# Patient Record
Sex: Male | Born: 1990 | Race: Black or African American | Hispanic: No | Marital: Single | Smoking: Current some day smoker
Health system: Southern US, Community
[De-identification: ages and names within clinical notes are randomized; demographics above are authoritative.]

## PROBLEM LIST (undated history)

## (undated) NOTE — ED Notes (Signed)
 Formatting of this note is different from the original. Emergency Services RN Report  S (Situation):  Chief Complaint(s):  Hand Injury  Associated Symptoms: None  B (Background):  Events leading to and Treatment PTA: Pt arrives with pain and swelling to right hand from reportedly slamming it in a vehicle door. Pt able to move fingers, nothing taken for pain.   Past Medical History:  No past medical history on file.  A (Assessment):  See documentation for Focused Assessment.  Initial Vital Signs:  ED Triage Vitals [03/18/22 1543]  Enc Vitals Group     BP 126/74     Heart Rate 56     Respiratory Rate 16     Temp 36.8 C (98.2 F)     Temp src Oral     SpO2 98 %     Weight 63.5 kg (140 lb)     Height 1.702 m (5' 7)     Head Circumference      Peak Flow      Pain Score      Pain Loc      Pain Edu?      Excl. in GC?    Pain on Presentation:  Pain Location: Hand   Safety:  Patient is oriented to person, place, time/date, and situation. The patient is oriented and cooperative.  Patient is able to make needs known by using call light appropriately.  Does the patient require enhanced safety measures? No  Mobility:  The patient?s mobility is ambulates independently   Isolation Needs: None  Learning Needs Assessment:   Primary Learner Name: Amiel Pepper  Primary Learner Role: Patient  Barriers to Learning: No Barriers  Primary Language for the Primary Learner: English  Is an interpreter Required: no  How does the primary learner prefer to learn new concepts: Listening   Patient?s Desired Outcome: ?What are you hoping to get out of today?s visit??: Help with hand pain  R (Recommendation):   Plan of Care; developed with patient: Yes Comfort and Safety: see Intentional Rounding, MAR Activations:  None Tests/Procedures:   ED Medications and Procedures  XR HAND MIN 3 VIEWS    Electronically signed by Stanek, [place], RN at 03/18/2022  3:52 PM CDT

## (undated) NOTE — ED Provider Notes (Signed)
 Formatting of this note is different from the original. Images from the original note were not included. Chief complaint: Allergic reaction (Pt states that 30 min ago, he ate some cucumbers. Per pt. He has a known pickle allergy. Pt is breaking out in hives on his face and arms. Pts lips and face are swelling. 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 Approximately 30 minutes prior to arrival the patient ate cucumbers seasoned with vinegar. Notes an allergy to pickles. Shortly after he noticed swelling to his eyes and tongue. Has also noted hives to his arms and around his eyes. Locates a tightness to his throat, notices his breathing is becoming more labored. No medication allergies. No treatments prior to arrival.     Data Review: I reviewed nursing notes and vital signs from current ED visit. I performed a limited review of the electronic medical record, ED nursing notes, past medical history and allergies.   Past Medical History: No past medical history on file. No past surgical history on file.   Medications: Medication list was reviewed by me. Current Outpatient Medications  Medication Sig   EPINEPHrine  (Epipen ) 0.3 MG/0.3ML auto-injector pen Inject 0.3 mL into muscle once as needed for Anaphylaxis   predniSONE  (Deltasone ) 50 MG tablet Take 1 (one) tablet by mouth once daily for 3 days    Physical Exam:  Patient Vitals for the past 24 hrs:  Temp Pulse Resp BP SpO2  08/21/23 2100 -- 64 -- 131/81 98 %  08/21/23 2030 -- 69 -- 135/87 100 %  08/21/23 2029 -- 72 -- -- 100 %  08/21/23 2005 -- 66 -- (!) 151/106 100 %  08/21/23 1937 -- 55 -- (!) 131/113 --  08/21/23 1905 97.9 F (36.6 C) 97 18 (!) 139/108 98 %   Nursing note and vitals reviewed.  General: Alert & Oriented to person, place, time & reason for visit.  No acute distress. Head: Normocephalic. Atraumatic. Eyes: Pupils equal and round.  EOM intact. Clear conjunctivae. No discharge.  There is periorbital edema with urticaria over the upper lids. Ears: External ears normal. Nose: External nose normal. Nares patent bilaterally. Mouth: Moist mucous membranes. Upper and lower labial edema. Neck: Trachea midline. Pulmonary: Non-labored breathing. No adverse respiratory sounds on approach. No stridor. Lungs clear to auscultation bilaterally.  No wheezes, rhonchi or rales.  Cardiovascular: Regular rate. Regular rhythm.  Well perfused.  Radial pulses intact bilaterally.  Skin: Warm, dry. Non-diaphoretic. Urticaria to the volar bilateral forearms.  Neuro: GCS 15. Moving all extremities with grossly intact strength. Ambulates without difficulty. Psych: Behavior and affect normal. Pleasant mood.    Orders placed this encounter: ED Medication Administration from 08/21/2023 1856 to 08/21/2023 2144     Date/Time Order Dose Route Action Action by   08/21/2023 1931 CDT EPINEPHrine  1 MG/ML injection 0.3 mg 0.3 mg Intramuscular $ Given Erich Domino, RN   08/21/2023 1937 CDT 0.9% NaCl IV bolus 1,000 mL Intravenous $ New Bag/Syringe Erich Domino, RN   08/21/2023 1938 CDT diphenhydrAMINE  (Benadryl ) injection 50 mg 50 mg Intravenous $ Given Erich Domino, RN   08/21/2023 1949 CDT famotidine  (Pepcid ) injection 40 mg 40 mg Intravenous $ Given Erich Domino, RN   08/21/2023 1944 CDT methylPREDNISolone  sod succ (SOLU-Medrol ) injection 125 mg 125 mg Intravenous $ Given Erich Domino, RN  Cardiopulmonary: none Labs: none No results found for any visits on 08/21/23. Diagnostic imaging: none No results found.    PROCEDURE DOCUMENTATION: Observation Care: Emergency Department Observation Documentation:  ED Observation: The patient was placed under ED observation status to determine the need for inpatient admission vs discharge. . Time: 7:14PM. Date: 08/21/2023.   Reason(s) for observation: Allergic reaction Evaluation/treatment plan: telemetry monitoring, IV fluids, and IV steroids Reassessment and update(s): reassessed patient at 9:00PM at which time urticaria has significantly improved, patient feeling better. Will hold until 2 hours post epi  Discharge from Observation: The patient was discharged from ED observation status. . Time: 9:35PM. Date: 08/21/2023.  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 Patient arrives nontoxic appearing, in no obvious respiratory distress. Initial vital signs upon arrival show hypertension. Here after presumed allergic reaction to cucumber. Primary survey demonstrates no sign of airway obstruction, no stridor, patient sitting upright in no obvious respiratory distress, Sp02 98% on room air. Labial and periorbital edema with urticarial rash around eyes and forearms with complaints of throat tightness. Given multiple symptoms involved, did feel epi was indicated for anaphylaxis.  The patient was treated symptomatically with IM epi, IV solumedrol and antihistamines (H1 and H2) and observed in the ED without deterioration of condition. Strongly advised continued antihistamine use specifically cetirizine. I did place an order for epi pen to have at home. Three days of prednisone  sent to the pharmacy for him to take over the next several days. Vital signs obtained prior to final disposition, patient hemodynamically stable, hypertension reduced to an acceptable range.    Counseling: Visualized patient ambulate in the ED without difficulty.  Stable for discharge home.  Specific recommendations and return precautions discussed as well as outlined in discharge instructions provided. Encouraged to follow up with primary care. Workup and plan of care discussed in detail. Questions answered and patient/family agreeable.  Medications Prescribed this Visit       Sig   EPINEPHrine  (Epipen ) 0.3 MG/0.3ML auto-injector pen Inject 0.3 mL into muscle once as needed for Anaphylaxis   predniSONE  (Deltasone ) 50 MG tablet Take 1 (one) tablet by mouth once daily for 3 days     Medical Complexity: Critical Care Documentation:  The patient has a critical illness or injury that acutely impairs one or more vital organs systems such that there is a high probability of imminent or life-threatening deterioration in the patient's condition. The patient's care requires complex decision making, monitoring and interventions to prevent clinical deterioration (see MDM for further details). I was immediately available for care of this patient.   Critical Care Condition(s):  AIRWAY: Allergic reaction requiring IM or IV epinephrine   Critical Care Time: < 30 min: 20  minutes was provided by Laddie JONELLE Sayre, PA-C. Critical care time was exclusive of separately billable procedures.    Diagnosis: 1. Anaphylaxis, initial encounter    Disposition:  Discharge   My supervising physician for this case was  Medford Ned, DO, who did evaluate this patient personally. Supervising physician was available for questions as well as guidance on assessment and plan.   Laddie Sayre, MPAS, PA-C 08/21/2023    Cosigned by Ned Lonni HERO, DO at 08/22/2023 12:20 AM CDT Electronically signed  by Vicky Laddie SAUNDERS, PA-C at 08/21/2023  9:45 PM CDT Electronically signed by Debby Lonni HERO, DO at 08/22/2023 12:20 AM CDT  Associated attestation - Debby Lonni HERO, DO - 08/22/2023 12:20 AM CDT Formatting of this note might be different from the original. 08/22/2023 00:19  For this patient  encounter, I reviewed the NP or PA documentation, procedures (if done), treatment plan, and medical decision making; and I had face-to-face time with this patient.   I have conducted an independent evaluation of this patient including a focused history and a physical exam - which are in concordance with the NP/PA documentation except as otherwise noted.  I provided a substantive portion of the care of this patient. I personally performed a majority of the medical decision making for this patient encounter.   Differential diagnosis includes allergic reaction, anaphylaxis On my exam patient did have some perioral and periorbital edema the oropharynx was clear his voice was normal there was no stridor he did have hives to his upper extremities and his upper back Patient was given IM epinephrine  in addition to antihistamine adjuncts Patient was doing well did not need any repeat dosing of epinephrine  was observed for 2 hours and was feeling comfortable at discharge

## (undated) NOTE — ED Notes (Signed)
 Formatting of this note might be different from the original. Pt is awake and alert GCS 15. Breathing is regular and nonlabored. Skin is warm and dry. Gait is steady with no assistance. Proper discharge clothing. Pt and partner provided discharge instructions. Medication education reviewed. Signs and symptoms warranting return discussed. Discharge teaching successful as evidence by no further questions/concerns/needs. Pt ready for discharge.   Electronically signed by Erich Domino, RN at 08/21/2023 10:50 PM CDT

## (undated) NOTE — ED Provider Notes (Signed)
 Formatting of this note is different from the original. Chief Complaint from Triage:  Chief Complaint  Patient presents with  ? Hand Injury    History of Present Illness: This 89 y.o. male patient presents to the ER for right hand injury.  Patient states he excellently slammed in a car door.  Pain yesterday and is worsening swelling.  Pain in his wrist.  No pain medication taken prior to arrival.  Medications & Allergies: Medication List  Reviewed  Past Medical History: No past medical history on file.  Past Surgical History: No past surgical history on file.  Family History: No family history on file.  Social History: Social History   Socioeconomic History  ? Marital status: Single   Review of Systems:  Physical Examination: BP 126/74   Pulse 56   Temp 36.8 C (98.2 F) (Oral)   Resp 16   Ht 170.2 cm (5' 7)   Wt 63.5 kg (140 lb)   SpO2 98%   BMI 21.93 kg/m   Constitutional: Well appearing.  No acute distress  Musculoskeletal: Edema dorsally and tenderness.  Able to range wrist freely.  Neurovascular intact.  ED Course:  6 year old male with right hand injury.  X-rays show proximal fourth metacarpal fracture.  See report below.  Patient will be splinted and recommend follow-up in hand/orthopedics clinic.  Tylenol  and ibuprofen  for pain.  Imaging Results     XR Hand Min 3 Views R (Final result)  Result time 03/18/22 16:35:37   Final result by Arthea GORMAN Ferrara, MD (03/18/22 16:35:37)       Impression:   Impression: Moderately displaced and mildly angulated fourth metacarpal proximal metaphyseal fracture with probable minimally displaced proximal fifth metacarpal metaphyseal fracture.  Arthea Ferrara - Electronically signed on 03/18/2022 1635      Narrative:   **THIS IS A SIGNED REPORT**  XR HAND MIN 3 VIEWS 03/18/2022   PROVIDED INDICATION: pain and swelling  COMPARISON: 07/19/2018  TECHNIQUE: Frontal, lateral and oblique views of the right  hand.  FINDINGS: Fractures seen involving the fourth metacarpal base with moderate displacement. An additional mildly displaced fractures seen of the fifth metacarpal base.          Nurses notes reviewed. Results: No results found for this or any previous visit (from the past 12 hour(s)). Medications  ibuprofen  tablet (600 mg Oral Given 03/18/22 1554)   Diagnosis:   ICD-10-CM   1. Closed fracture of right hand, initial encounter  S62.91XA Amb Ref to Ortho - Trauma [facility]- Urgent    Disposition: Discharge   Prescribed Medications: Discharge Medication List as of 03/18/2022  4:33 PM     Deward CHRISTELLA Neri, MD 03/18/22 1712  Electronically signed by Neri Deward CHRISTELLA, MD at 03/18/2022  5:12 PM CDT

## (undated) NOTE — ED Triage Notes (Signed)
 Formatting of this note might be different from the original. Patient arriving at Emergency Department (ED) with CC: right hand injury. Occurred roughly 1530 yesterday. Patient got hand slammed in car door. No medications PTA. No broken skin. +swelling noted. Patient is able to wiggle fingers. +radial pulse felt. Electronically signed by Zimmermann, [place] R, RN at 03/18/2022  3:42 PM CDT

## (undated) NOTE — ED Provider Notes (Signed)
 Formatting of this note might be different from the original. Splint Location: right hand Type of splint applied: volar short arm After application of stockinet the orthoglass material was moistened with cool water and wrung out.  It was applied to the affected extremity and affixed with an ACE wrap.  The extremity was held in a position of function and CMS was tested before and after and intact. Patient reported no complaints during the procedure. Verbal consent was obtained prior to application.  Oneil Coe, [place] 03/18/22 1632  Cosigned by Stacy Deward HERO, MD at 03/18/2022  9:17 PM CDT Electronically signed by Coe Oneil, PA at 03/18/2022  4:32 PM CDT Electronically signed by Stacy Deward HERO, MD at 03/18/2022  9:17 PM CDT

---

## 2017-04-04 ENCOUNTER — Ambulatory Visit (HOSPITAL_COMMUNITY)
Admission: EM | Admit: 2017-04-04 | Discharge: 2017-04-04 | Disposition: A | Discharge status: Home / Self Care | Payer: Self-pay | Attending: Internal Medicine | Admitting: Internal Medicine

## 2017-04-04 ENCOUNTER — Encounter (HOSPITAL_COMMUNITY): Payer: Self-pay

## 2017-04-04 ENCOUNTER — Ambulatory Visit (INDEPENDENT_AMBULATORY_CARE_PROVIDER_SITE_OTHER): Payer: Self-pay

## 2017-04-04 DIAGNOSIS — F17210 Nicotine dependence, cigarettes, uncomplicated: Secondary | ICD-10-CM | POA: Insufficient documentation

## 2017-04-04 DIAGNOSIS — M795 Residual foreign body in soft tissue: Secondary | ICD-10-CM

## 2017-04-04 DIAGNOSIS — S60221A Contusion of right hand, initial encounter: Secondary | ICD-10-CM | POA: Insufficient documentation

## 2017-04-04 DIAGNOSIS — Z202 Contact with and (suspected) exposure to infections with a predominantly sexual mode of transmission: Secondary | ICD-10-CM | POA: Insufficient documentation

## 2017-04-04 DIAGNOSIS — Z189 Retained foreign body fragments, unspecified material: Secondary | ICD-10-CM

## 2017-04-04 DIAGNOSIS — M79641 Pain in right hand: Secondary | ICD-10-CM

## 2017-04-04 DIAGNOSIS — T8189XA Other complications of procedures, not elsewhere classified, initial encounter: Secondary | ICD-10-CM

## 2017-04-04 DIAGNOSIS — X58XXXA Exposure to other specified factors, initial encounter: Secondary | ICD-10-CM | POA: Insufficient documentation

## 2017-04-04 DIAGNOSIS — Z113 Encounter for screening for infections with a predominantly sexual mode of transmission: Secondary | ICD-10-CM

## 2017-04-04 MED ORDER — NAPROXEN 500 MG PO TABS: 500.0000 mg | ORAL_TABLET | Freq: Two times a day (BID) | ORAL | 0 refills | Status: DC | PRN

## 2017-04-04 MED ORDER — CEFTRIAXONE SODIUM 250 MG IJ SOLR
INTRAMUSCULAR | Status: AC
  Filled 2017-04-04: qty 250

## 2017-04-04 MED ORDER — AZITHROMYCIN 250 MG PO TABS
ORAL_TABLET | ORAL | Status: AC
  Filled 2017-04-04: qty 4

## 2017-04-04 MED ORDER — CEFTRIAXONE SODIUM 250 MG IJ SOLR
250.0000 mg | Freq: Once | INTRAMUSCULAR | Status: AC
  Administered 2017-04-04: 250 mg via INTRAMUSCULAR

## 2017-04-04 MED ORDER — AZITHROMYCIN 250 MG PO TABS
1000.0000 mg | ORAL_TABLET | Freq: Once | ORAL | Status: AC
  Administered 2017-04-04: 1000 mg via ORAL

## 2017-04-04 NOTE — ED Provider Notes (Signed)
CSN: [id number]     Arrival date & time 04/04/17  1007 History   First MD Initiated Contact with Patient 04/04/17 1153     Chief Complaint  Patient presents with  . Hand Pain   (Consider location/radiation/quality/duration/timing/severity/associated sxs/prior Treatment) 26 year old male presents with multiple concerns. 1st is injury to his right hand. He slammed his hand in the car door yesterday afternoon. He thought it would get better overnight but now he is experiencing more swelling, pain and difficulty moving his fingers. He has not taken anything for pain. 2nd concern is request for STD testing. No distinct dysuria, discharge or penile pain but has had 3 partners in the past 3 months and occasionally uses condoms. Requests treatment for STD's today. 3rd concern is stitches that were placed in the left side of his upper lip and right upper eyelid/eyebrow over 1 year ago in [place]. They were suppose to dissolve but now he sees the ends sticking out. Uncertain what to do next. No other chronic health issues. Takes no daily medication.    The history is provided by the patient.    History reviewed. No pertinent past medical history. History reviewed. No pertinent surgical history. No family history on file. Social History  Substance Use Topics  . Smoking status: Current Some Day Smoker    Types: Cigarettes  . Smokeless tobacco: Never Used  . Alcohol use 2.4 oz/week    4 Standard drinks or equivalent per week    Review of Systems  Constitutional: Negative for appetite change, chills, fatigue and fever.  HENT: Negative for sore throat.   Respiratory: Negative for cough, chest tightness, shortness of breath and wheezing.   Cardiovascular: Negative for chest pain.  Gastrointestinal: Negative for abdominal pain, diarrhea, nausea and vomiting.  Genitourinary: Negative for decreased urine volume, difficulty urinating, discharge, dysuria, flank pain, frequency, genital sores,  hematuria, penile pain, penile swelling, scrotal swelling, testicular pain and urgency.  Musculoskeletal: Positive for arthralgias and joint swelling. Negative for back pain and neck pain.  Skin: Positive for color change. Negative for rash and wound.  Neurological: Negative for dizziness, weakness, numbness and headaches.  Hematological: Negative for adenopathy.    Allergies  Patient has no known allergies.  Home Medications   Prior to Admission medications   Medication Sig Start Date End Date Taking? Authorizing Provider  naproxen (NAPROSYN) 500 MG tablet Take 1 tablet (500 mg total) by mouth 2 (two) times daily as needed for moderate pain. 04/04/17   Sudie Grumbling, NP   Meds Ordered and Administered this Visit   Medications  azithromycin [facility]) tablet 1,000 mg (1,000 mg Oral Given 04/04/17 1220)  cefTRIAXone (ROCEPHIN) injection 250 mg (250 mg Intramuscular Given 04/04/17 1219)    BP 131/80 (BP Location: Left Arm)   Pulse 83   Temp 98.3 F (36.8 C) (Oral)   Resp 20   SpO2 100%  No data found.   Physical Exam  Constitutional: He is oriented to person, place, and time. He appears well-developed and well-nourished. No distress.  HENT:  Head: Normocephalic.    Right Ear: Hearing and external ear normal.  Left Ear: Hearing and external ear normal.  Nose: Nose normal.  Mouth/Throat: Uvula is midline, oropharynx is clear and moist and mucous membranes are normal.    Ends of 2 white sutures present on left side of upper lip. Another white suture end present below right eyebrow.   Neck: Normal range of motion. Neck supple.  Cardiovascular: Normal  rate, regular rhythm and normal heart sounds.   No murmur heard. Pulmonary/Chest: Effort normal and breath sounds normal. No respiratory distress.  Abdominal: Soft. Bowel sounds are normal. There is no tenderness.  Musculoskeletal: He exhibits edema and tenderness.       Right hand: He exhibits decreased range of motion,  tenderness and swelling. He exhibits normal two-point discrimination, normal capillary refill, no deformity and no laceration. Normal sensation noted. Normal strength noted.       Hands: Swelling, tenderness along 3rd to 5th metacarpal of right hand. Slight bruising present. Unable to bend hand or fingers due to pain and swelling. Good pulses and capillary refill. No neuro deficits noted.   Neurological: He is alert and oriented to person, place, and time. He has normal strength. No sensory deficit.  Skin: Skin is warm and dry.    Urgent Care Course     Procedures (including critical care time)  Labs Review Labs Reviewed  URINE CYTOLOGY ANCILLARY ONLY    Imaging Review Dg Hand Complete Right  Result Date: 04/04/2017 CLINICAL DATA:  Right hand pain after injury in car door last night. EXAM: RIGHT HAND - COMPLETE 3+ VIEW COMPARISON:  None. FINDINGS: There is no evidence of fracture or dislocation. There is no evidence of arthropathy or other focal bone abnormality. Soft tissues are unremarkable. IMPRESSION: No acute abnormality seen in the right hand. Electronically Signed   By: Lupita RaiderJames  Green Jr, M.D.   On: 04/04/2017 12:20     Visual Acuity Review  Right Eye Distance:   Left Eye Distance:   Bilateral Distance:    Right Eye Near:   Left Eye Near:    Bilateral Near:         MDM   1. Contusion of right hand, initial encounter   2. Right hand pain   3. Potential exposure to STD   4. Retained suture, initial encounter    Reviewed x-ray results with patient. No fracture or dislocation seen. Recommend start Naproxen 500mg  twice a day as directed for pain and swelling. Keep hand in ace wrap for support and comfort. May apply ice to area for next 24 hours. Recommend follow-up here if pain and swelling do not improve within 5 days. Note written for work.  Urine obtained for STD testing. Gave Rocephin 250mg  IM and Zithromax 1g orally for treatment. No sexual intercourse for 2 weeks.  Use condoms with each and every sexual encounter. Follow-up pending lab results. Information provided regarding local Surgeon for further evaluation of retained sutures. Encouraged to call today to schedule appointment.     Sudie GrumblingAmyot, Shaunta Oncale Berry, NP 04/04/17 [phone]

## 2017-04-04 NOTE — ED Triage Notes (Signed)
Pt slammed his right hand in the car door yesterday and wanted it looked at. Can't move his hand. Didn't take anything otc. Pt also has stiches in his lip from last year and said they were suppose to be dissolvable but said some are still left in. Also has stitch in his right eyebrow that also did dissolve. And also wants a std testing, pt left a urine sample.

## 2017-04-04 NOTE — Discharge Instructions (Signed)
You were given a shot of Rocephin and oral pills (Zithromax) for potential Gonorrhea and Chlamydia. Recommend start Naproxen 500mg  twice a day as directed for pain and swelling. Keep right hand in ace wrap for support and comfort. May apply ice to area for next 24 hours. Recommend follow-up if pain and swelling do not improve within 5 days. Also follow-up pending lab results. Call the Surgeon today to schedule evaluation for suture removal.

## 2017-04-04 NOTE — ED Notes (Signed)
Urine specimen in lab 

## 2017-04-07 LAB — URINE CYTOLOGY ANCILLARY ONLY
Chlamydia: NEGATIVE
Neisseria Gonorrhea: POSITIVE — AB
Trichomonas: NEGATIVE

## 2019-05-15 ENCOUNTER — Encounter (HOSPITAL_COMMUNITY): Payer: Self-pay | Admitting: Student

## 2019-05-15 ENCOUNTER — Emergency Department (HOSPITAL_COMMUNITY)
Admission: EM | Admit: 2019-05-15 | Discharge: 2019-05-15 | Disposition: A | Discharge status: Home / Self Care | Payer: Self-pay | Attending: Emergency Medicine | Admitting: Emergency Medicine

## 2019-05-15 ENCOUNTER — Other Ambulatory Visit: Payer: Self-pay

## 2019-05-15 DIAGNOSIS — T7840XA Allergy, unspecified, initial encounter: Secondary | ICD-10-CM | POA: Insufficient documentation

## 2019-05-15 DIAGNOSIS — F17210 Nicotine dependence, cigarettes, uncomplicated: Secondary | ICD-10-CM | POA: Insufficient documentation

## 2019-05-15 MED ORDER — FAMOTIDINE IN NACL 20-0.9 MG/50ML-% IV SOLN
20.0000 mg | Freq: Once | INTRAVENOUS | Status: AC
  Administered 2019-05-15: 20 mg via INTRAVENOUS
  Filled 2019-05-15: qty 50

## 2019-05-15 MED ORDER — EPINEPHRINE 0.3 MG/0.3ML IJ SOAJ: 0.3000 mg | INTRAMUSCULAR | 0 refills | Status: AC | PRN

## 2019-05-15 MED ORDER — PREDNISONE 50 MG PO TABS: 50.0000 mg | ORAL_TABLET | Freq: Every day | ORAL | 0 refills | Status: AC

## 2019-05-15 MED ORDER — DIPHENHYDRAMINE HCL 25 MG PO TABS: 25.0000 mg | ORAL_TABLET | Freq: Four times a day (QID) | ORAL | 0 refills | Status: AC | PRN

## 2019-05-15 MED ORDER — FAMOTIDINE 20 MG PO TABS: 20.0000 mg | ORAL_TABLET | Freq: Two times a day (BID) | ORAL | 0 refills | Status: AC

## 2019-05-15 MED ORDER — METHYLPREDNISOLONE SODIUM SUCC 125 MG IJ SOLR
125.0000 mg | Freq: Once | INTRAMUSCULAR | Status: AC
  Administered 2019-05-15: 125 mg via INTRAVENOUS
  Filled 2019-05-15: qty 2

## 2019-05-15 NOTE — ED Notes (Signed)
Bed: TD17 Expected date: 05/15/19 Expected time: 11:05 AM Means of arrival: Ambulance Comments: Allergic Reaction

## 2019-05-15 NOTE — Discharge Instructions (Addendum)
You were seen in the emergency department today for an allergic reaction.  You received epinephrine as well as steroids, Benadryl, and Pepcid. We are sending you home with a course of steroids to take for the next 5 days daily.  Also send you home with Pepcid and Benadryl to take as needed for itching.   We are sending you home with an EpiPen to take as needed should you have another allergic reaction where your throat feels like it is closing or swelling.  Please use this as needed.  If you use the EpiPen be sure to call 911.  We have prescribed you new medication(s) today. Discuss the medications prescribed today with your pharmacist as they can have adverse effects and interactions with your other medicines including over the counter and prescribed medications. Seek medical evaluation if you start to experience new or abnormal symptoms after taking one of these medicines, seek care immediately if you start to experience difficulty breathing, feeling of your throat closing, facial swelling, or rash as these could be indications of a more serious allergic reaction  Do not eat anymore pickles.  Follow-up with primary care within 3 days.  Return to the ER for new or worsening symptoms or any other concerns.

## 2019-05-15 NOTE — ED Triage Notes (Signed)
Patient arrived via [facility], patient is from home and is AOx4 and ambulatory. Patient began to have trouble breathing after eating a pickle, patient consumed about 1-2 pickles. Patient called 911 due to feeling like throat was closing up. EMS have given the following in route to [facility]. Vitals stable, patient has no pain or complaints.  0.3mg  IM Epi 50mg  benadryl given IV

## 2019-05-15 NOTE — ED Notes (Signed)
ED Provider at bedside. 

## 2019-05-15 NOTE — ED Provider Notes (Addendum)
[facility]-EMERGENCY DEPT Provider Note   CSN: [id number] Arrival date & time: 05/15/19  1113     History   Chief Complaint Chief Complaint  Patient presents with  . Allergic Reaction     HPI Blake Williams is a 28 y.o. male w/ a hx of marijuana use who presents to the ED via EMS w/ complaints of allergic reaction which occurred just PTA. Patient states he woke up this AM feeling well, sometime mid morning he ate 1.5 pickles and approximately 30 minutes later developed sensation of throat swelling/closing w/ dyspnea, facial swelling & pruritus to the face & trunk. He called 911. EMS administered 0.3 mg IM Epi as 50 mg of IV benadryl. Patient states sxs are substantially improving, still feels he has a bit of facial swelling w/ itching to the chest/back. No current dyspnea. Denies nausea, vomiting, abdominal pain, chest pain, or syncope. No hx of prior. He has no known allergies prior to this. He has had pickles previously, thinks this may have been a new brand, but has never had this type of reaction. No other PO intake or new exposures that he is aware of.      HPI  History reviewed. No pertinent past medical history.  There are no active problems to display for this patient.   History reviewed. No pertinent surgical history.      Home Medications    Prior to Admission medications   Medication Sig Start Date End Date Taking? Authorizing Provider  naproxen (NAPROSYN) 500 MG tablet Take 1 tablet (500 mg total) by mouth 2 (two) times daily as needed for moderate pain. 04/04/17   Sudie GrumblingAmyot, Ann Berry, NP    Family History History reviewed. No pertinent family history.  Social History Social History   Tobacco Use  . Smoking status: Current Some Day Smoker    Types: Cigarettes  . Smokeless tobacco: Never Used  Substance Use Topics  . Alcohol use: Yes    Alcohol/week: 4.0 standard drinks    Types: 4 Standard drinks or equivalent per week    Comment:  Socially   . Drug use: Yes    Frequency: 1.0 times per week    Types: Marijuana     Allergies   Patient has no known allergies.   Review of Systems Review of Systems  Constitutional: Negative for chills and fever.  HENT: Positive for facial swelling.        + for throat closing/swelling.   Respiratory: Positive for shortness of breath.   Cardiovascular: Negative for chest pain.  Gastrointestinal: Negative for abdominal pain, nausea and vomiting.  Skin:       + for pruritus.   All other systems reviewed and are negative.    Physical Exam Updated Vital Signs BP 130/84 (BP Location: Left Arm)   Pulse 71   Temp 98.6 F (37 C) (Oral)   Resp 20   Ht 5\' 7"  (1.702 m)   Wt 59 kg   SpO2 97%   BMI 20.36 kg/m   Physical Exam Vitals signs and nursing note reviewed.  Constitutional:      General: He is not in acute distress.    Appearance: He is well-developed. He is not toxic-appearing.  HENT:     Head: Normocephalic and atraumatic.     Mouth/Throat:     Pharynx: Oropharynx is clear. Uvula midline. No pharyngeal swelling, oropharyngeal exudate or posterior oropharyngeal erythema.     Comments: Posterior oropharynx is symmetric appearing. Patient tolerating own  secretions without difficulty. No trismus. No drooling. No hot potato voice. No swelling beneath the tongue, submandibular compartment is soft. Airway is patent. No tongue/lip swelling noted.  Eyes:     General:        Right eye: No discharge.        Left eye: No discharge.     Conjunctiva/sclera: Conjunctivae normal.     Comments: Mild swelling noted to upper eyelids.   Neck:     Musculoskeletal: Neck supple.  Cardiovascular:     Rate and Rhythm: Normal rate and regular rhythm.  Pulmonary:     Effort: Pulmonary effort is normal. No respiratory distress.     Breath sounds: Normal breath sounds. No wheezing, rhonchi or rales.  Abdominal:     General: There is no distension.     Palpations: Abdomen is soft.      Tenderness: There is no abdominal tenderness.  Skin:    General: Skin is warm and dry.     Comments: There is some erythema noted to the anterior upper chest, does not seem obviously urticarial. No warmth. No fluctuance. No abscess. No vesicles. No palm/sole rashes.   Neurological:     Mental Status: He is alert.     Comments: Clear speech.   Psychiatric:        Behavior: Behavior normal.     ED Treatments / Results  Labs (all labs ordered are listed, but only abnormal results are displayed) Labs Reviewed - No data to display  EKG    Radiology No results found.  Procedures Procedures (including critical care time)  Medications Ordered in ED Medications  methylPREDNISolone sodium succinate (SOLU-MEDROL) 125 mg/2 mL injection 125 mg (125 mg Intravenous Given 05/15/19 1148)  famotidine (PEPCID) IVPB 20 mg premix (20 mg Intravenous New Bag/Given 05/15/19 1148)     Initial Impression / Assessment and Plan / ED Course  I have reviewed the triage vital signs and the nursing notes.  Pertinent labs & imaging results that were available during my care of the patient were reviewed by me and considered in my medical decision making (see chart for details).   Patient presents to the ED via EMS for allergic reaction. Has received epinephrine & benadryl en route with improvement in his sxs. On exam patient does not appear to be in respiratory distress, his airway is patent, no stridor, no lip/intra-oral edema noted. Mild swelling to upper eyelids w/ mild erythema to anterior chest otherwise benign exam. History does seem consistent w/ allergic reaction. Will administer solumedrol & pepcid w/ plan for monitoring for 3-4 hours in the ER s/p epi.   Patient had brief episode where he felt the throat closing/swelling sensation was returning however this subsided, he was evaluated @ the bedside during these sxs with a reassuring exam. He has otherwise been asymptomatic since medication  administration. Airway remains patent. He appears appropriate for discharge. Will send home with steroids, benadryl, & pepcid as well as epi pen to use as needed. Discussed not to eat anymore pickles.  I discussed treatment plan, need for follow-up, and return precautions with the patient. Provided opportunity for questions, patient confirmed understanding and is in agreement with plan.   Findings and plan of care discussed with supervising physician Dr. Juleen ChinaKohut who evaluated patient during encounter & is in agreement.   Final Clinical Impressions(s) / ED Diagnoses   Final diagnoses:  Allergic reaction, initial encounter    ED Discharge Orders  Ordered    predniSONE (DELTASONE) 50 MG tablet  Daily with breakfast     05/15/19 1530    EPINEPHrine 0.3 mg/0.3 mL IJ SOAJ injection  As needed     05/15/19 1530    diphenhydrAMINE (BENADRYL) 25 MG tablet  Every 6 hours PRN     05/15/19 1530    famotidine (PEPCID) 20 MG tablet  2 times daily     05/15/19 1530           Jahmeir Geisen, [place] R, PA-C 05/15/19 [address] [place], PA-C 05/15/19 1536    Virgel Manifold, MD 05/16/19 [phone]

## 2019-08-26 ENCOUNTER — Emergency Department (HOSPITAL_COMMUNITY)
Admission: EM | Admit: 2019-08-26 | Discharge: 2019-08-27 | Disposition: A | Discharge status: Home / Self Care | Payer: Self-pay | Attending: Emergency Medicine | Admitting: Emergency Medicine

## 2019-08-26 DIAGNOSIS — Y939 Activity, unspecified: Secondary | ICD-10-CM | POA: Insufficient documentation

## 2019-08-26 DIAGNOSIS — S50312A Abrasion of left elbow, initial encounter: Secondary | ICD-10-CM | POA: Insufficient documentation

## 2019-08-26 DIAGNOSIS — S50311A Abrasion of right elbow, initial encounter: Secondary | ICD-10-CM | POA: Insufficient documentation

## 2019-08-26 DIAGNOSIS — S0083XA Contusion of other part of head, initial encounter: Secondary | ICD-10-CM | POA: Insufficient documentation

## 2019-08-26 DIAGNOSIS — S301XXA Contusion of abdominal wall, initial encounter: Secondary | ICD-10-CM | POA: Insufficient documentation

## 2019-08-26 DIAGNOSIS — H1132 Conjunctival hemorrhage, left eye: Secondary | ICD-10-CM | POA: Insufficient documentation

## 2019-08-26 DIAGNOSIS — Y92481 Parking lot as the place of occurrence of the external cause: Secondary | ICD-10-CM | POA: Insufficient documentation

## 2019-08-26 DIAGNOSIS — S80212A Abrasion, left knee, initial encounter: Secondary | ICD-10-CM | POA: Insufficient documentation

## 2019-08-26 DIAGNOSIS — F10920 Alcohol use, unspecified with intoxication, uncomplicated: Secondary | ICD-10-CM | POA: Insufficient documentation

## 2019-08-26 DIAGNOSIS — R519 Headache, unspecified: Secondary | ICD-10-CM | POA: Insufficient documentation

## 2019-08-26 DIAGNOSIS — Y908 Blood alcohol level of 240 mg/100 ml or more: Secondary | ICD-10-CM | POA: Insufficient documentation

## 2019-08-26 DIAGNOSIS — S20212A Contusion of left front wall of thorax, initial encounter: Secondary | ICD-10-CM | POA: Insufficient documentation

## 2019-08-26 DIAGNOSIS — S80211A Abrasion, right knee, initial encounter: Secondary | ICD-10-CM | POA: Insufficient documentation

## 2019-08-26 DIAGNOSIS — Y999 Unspecified external cause status: Secondary | ICD-10-CM | POA: Insufficient documentation

## 2019-08-26 DIAGNOSIS — R4182 Altered mental status, unspecified: Secondary | ICD-10-CM | POA: Insufficient documentation

## 2019-08-27 ENCOUNTER — Encounter (HOSPITAL_COMMUNITY): Payer: Self-pay

## 2019-08-27 ENCOUNTER — Other Ambulatory Visit: Payer: Self-pay

## 2019-08-27 ENCOUNTER — Emergency Department (HOSPITAL_COMMUNITY): Payer: Self-pay

## 2019-08-27 ENCOUNTER — Encounter (HOSPITAL_COMMUNITY): Payer: Self-pay | Admitting: Student

## 2019-08-27 LAB — CBC WITH DIFFERENTIAL/PLATELET
Abs Immature Granulocytes: 0.03 10*3/uL (ref 0.00–0.07)
Basophils Absolute: 0 10*3/uL (ref 0.0–0.1)
Basophils Relative: 0 %
Eosinophils Absolute: 0 10*3/uL (ref 0.0–0.5)
Eosinophils Relative: 0 %
HCT: 48.1 % (ref 39.0–52.0)
Hemoglobin: 15.7 g/dL (ref 13.0–17.0)
Immature Granulocytes: 0 %
Lymphocytes Relative: 10 %
Lymphs Abs: 0.8 10*3/uL (ref 0.7–4.0)
MCH: 29.9 pg (ref 26.0–34.0)
MCHC: 32.6 g/dL (ref 30.0–36.0)
MCV: 91.6 fL (ref 80.0–100.0)
Monocytes Absolute: 0.3 10*3/uL (ref 0.1–1.0)
Monocytes Relative: 4 %
Neutro Abs: 6.5 10*3/uL (ref 1.7–7.7)
Neutrophils Relative %: 86 %
Platelets: 257 10*3/uL (ref 150–400)
RBC: 5.25 MIL/uL (ref 4.22–5.81)
RDW: 12 % (ref 11.5–15.5)
WBC: 7.6 10*3/uL (ref 4.0–10.5)
nRBC: 0 % (ref 0.0–0.2)

## 2019-08-27 LAB — COMPREHENSIVE METABOLIC PANEL
ALT: 22 U/L (ref 0–44)
AST: 38 U/L (ref 15–41)
Albumin: 4.9 g/dL (ref 3.5–5.0)
Alkaline Phosphatase: 36 U/L — ABNORMAL LOW (ref 38–126)
Anion gap: 15 (ref 5–15)
BUN: 10 mg/dL (ref 6–20)
CO2: 19 mmol/L — ABNORMAL LOW (ref 22–32)
Calcium: 9.4 mg/dL (ref 8.9–10.3)
Chloride: 108 mmol/L (ref 98–111)
Creatinine, Ser: 1.07 mg/dL (ref 0.61–1.24)
GFR calc Af Amer: >60 mL/min (ref >60)
GFR calc non Af Amer: >60 mL/min (ref >60)
Glucose, Bld: 107 mg/dL — ABNORMAL HIGH (ref 70–99)
Potassium: 3.2 mmol/L — ABNORMAL LOW (ref 3.5–5.1)
Sodium: 142 mmol/L (ref 135–145)
Total Bilirubin: 1.3 mg/dL — ABNORMAL HIGH (ref 0.3–1.2)
Total Protein: 7.2 g/dL (ref 6.5–8.1)

## 2019-08-27 LAB — ETHANOL: Alcohol, Ethyl (B): 307 mg/dL (ref <10)

## 2019-08-27 MED ORDER — SODIUM CHLORIDE 0.9 % IV BOLUS
1000.0000 mL | Freq: Once | INTRAVENOUS | Status: AC
  Administered 2019-08-27: 1000 mL via INTRAVENOUS

## 2019-08-27 MED ORDER — POLYMYXIN B-TRIMETHOPRIM 10000-0.1 UNIT/ML-% OP SOLN: 1.0000 [drp] | OPHTHALMIC | 0 refills | Status: AC

## 2019-08-27 MED ORDER — FLUORESCEIN SODIUM 1 MG OP STRP
1.0000 | ORAL_STRIP | Freq: Once | OPHTHALMIC | Status: DC
  Filled 2019-08-27: qty 1

## 2019-08-27 MED ORDER — TETANUS-DIPHTH-ACELL PERTUSSIS 5-2.5-18.5 LF-MCG/0.5 IM SUSP
0.5000 mL | Freq: Once | INTRAMUSCULAR | Status: DC
  Filled 2019-08-27: qty 0.5

## 2019-08-27 MED ORDER — IOHEXOL 300 MG/ML  SOLN
100.0000 mL | Freq: Once | INTRAMUSCULAR | Status: AC | PRN
  Administered 2019-08-27: 01:00:00 100 mL via INTRAVENOUS

## 2019-08-27 MED ORDER — TETRACAINE HCL 0.5 % OP SOLN
2.0000 [drp] | Freq: Once | OPHTHALMIC | Status: DC
  Filled 2019-08-27: qty 4

## 2019-08-27 NOTE — ED Notes (Signed)
Ortho tech called 
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
## 2019-08-27 NOTE — ED Notes (Signed)
Pt refusing to participate in visual acuity screening.

## 2019-08-27 NOTE — ED Notes (Signed)
Patient mostly uncooperative. Will not answer questions, unable to obtain history or complete a proper assessment.
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
## 2019-12-25 ENCOUNTER — Emergency Department (HOSPITAL_COMMUNITY)
Admission: EM | Admit: 2019-12-25 | Discharge: 2019-12-26 | Disposition: A | Discharge status: Home / Self Care | Payer: No Typology Code available for payment source | Attending: Emergency Medicine | Admitting: Emergency Medicine

## 2019-12-25 ENCOUNTER — Emergency Department (HOSPITAL_COMMUNITY): Payer: No Typology Code available for payment source

## 2019-12-25 ENCOUNTER — Encounter (HOSPITAL_COMMUNITY): Payer: Self-pay | Admitting: *Deleted

## 2019-12-25 ENCOUNTER — Other Ambulatory Visit: Payer: Self-pay

## 2019-12-25 DIAGNOSIS — K760 Fatty (change of) liver, not elsewhere classified: Secondary | ICD-10-CM | POA: Insufficient documentation

## 2019-12-25 DIAGNOSIS — Z79899 Other long term (current) drug therapy: Secondary | ICD-10-CM | POA: Diagnosis not present

## 2019-12-25 DIAGNOSIS — F10920 Alcohol use, unspecified with intoxication, uncomplicated: Secondary | ICD-10-CM | POA: Diagnosis not present

## 2019-12-25 DIAGNOSIS — Y939 Activity, unspecified: Secondary | ICD-10-CM | POA: Diagnosis not present

## 2019-12-25 DIAGNOSIS — Y906 Blood alcohol level of 120-199 mg/100 ml: Secondary | ICD-10-CM | POA: Diagnosis not present

## 2019-12-25 DIAGNOSIS — Y999 Unspecified external cause status: Secondary | ICD-10-CM | POA: Diagnosis not present

## 2019-12-25 DIAGNOSIS — M542 Cervicalgia: Secondary | ICD-10-CM | POA: Insufficient documentation

## 2019-12-25 DIAGNOSIS — S2222XA Fracture of body of sternum, initial encounter for closed fracture: Secondary | ICD-10-CM

## 2019-12-25 DIAGNOSIS — F17210 Nicotine dependence, cigarettes, uncomplicated: Secondary | ICD-10-CM | POA: Insufficient documentation

## 2019-12-25 DIAGNOSIS — F1290 Cannabis use, unspecified, uncomplicated: Secondary | ICD-10-CM | POA: Diagnosis not present

## 2019-12-25 DIAGNOSIS — R413 Other amnesia: Secondary | ICD-10-CM | POA: Insufficient documentation

## 2019-12-25 DIAGNOSIS — S22008A Other fracture of unspecified thoracic vertebra, initial encounter for closed fracture: Secondary | ICD-10-CM | POA: Diagnosis not present

## 2019-12-25 DIAGNOSIS — R0789 Other chest pain: Secondary | ICD-10-CM | POA: Insufficient documentation

## 2019-12-25 DIAGNOSIS — Y929 Unspecified place or not applicable: Secondary | ICD-10-CM | POA: Insufficient documentation

## 2019-12-25 DIAGNOSIS — T07XXXA Unspecified multiple injuries, initial encounter: Secondary | ICD-10-CM | POA: Diagnosis present

## 2019-12-25 LAB — CBC WITH DIFFERENTIAL/PLATELET
Abs Immature Granulocytes: 0.11 10*3/uL — ABNORMAL HIGH (ref 0.00–0.07)
Basophils Absolute: 0 10*3/uL (ref 0.0–0.1)
Basophils Relative: 0 %
Eosinophils Absolute: 0 10*3/uL (ref 0.0–0.5)
Eosinophils Relative: 1 %
HCT: 44.6 % (ref 39.0–52.0)
Hemoglobin: 14.5 g/dL (ref 13.0–17.0)
Immature Granulocytes: 2 %
Lymphocytes Relative: 16 %
Lymphs Abs: 0.9 10*3/uL (ref 0.7–4.0)
MCH: 29.7 pg (ref 26.0–34.0)
MCHC: 32.5 g/dL (ref 30.0–36.0)
MCV: 91.4 fL (ref 80.0–100.0)
Monocytes Absolute: 0.3 10*3/uL (ref 0.1–1.0)
Monocytes Relative: 4 %
Neutro Abs: 4.3 10*3/uL (ref 1.7–7.7)
Neutrophils Relative %: 77 %
Platelets: 103 10*3/uL — ABNORMAL LOW (ref 150–400)
RBC: 4.88 MIL/uL (ref 4.22–5.81)
RDW: 12.4 % (ref 11.5–15.5)
WBC: 5.6 10*3/uL (ref 4.0–10.5)
nRBC: 0 % (ref 0.0–0.2)

## 2019-12-25 LAB — SAMPLE TO BLOOD BANK

## 2019-12-25 LAB — I-STAT BETA HCG BLOOD, ED (MC, WL, AP ONLY): I-stat hCG, quantitative: <5 m[IU]/mL (ref <5)

## 2019-12-25 MED ORDER — MORPHINE SULFATE (PF) 4 MG/ML IV SOLN
4.0000 mg | Freq: Once | INTRAVENOUS | Status: AC
  Administered 2019-12-25: 4 mg via INTRAVENOUS
  Filled 2019-12-25: qty 1
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
## 2019-12-26 ENCOUNTER — Other Ambulatory Visit: Payer: Self-pay

## 2019-12-26 ENCOUNTER — Emergency Department (HOSPITAL_COMMUNITY): Payer: No Typology Code available for payment source

## 2019-12-26 LAB — RAPID URINE DRUG SCREEN, HOSP PERFORMED
Amphetamines: NOT DETECTED
Barbiturates: NOT DETECTED
Benzodiazepines: NOT DETECTED
Cocaine: NOT DETECTED
Opiates: POSITIVE — AB
Tetrahydrocannabinol: POSITIVE — AB

## 2019-12-26 LAB — COMPREHENSIVE METABOLIC PANEL
ALT: 111 U/L — ABNORMAL HIGH (ref 0–44)
AST: 153 U/L — ABNORMAL HIGH (ref 15–41)
Albumin: 4.9 g/dL (ref 3.5–5.0)
Alkaline Phosphatase: 36 U/L — ABNORMAL LOW (ref 38–126)
Anion gap: 16 — ABNORMAL HIGH (ref 5–15)
BUN: 14 mg/dL (ref 6–20)
CO2: 22 mmol/L (ref 22–32)
Calcium: 9.5 mg/dL (ref 8.9–10.3)
Chloride: 102 mmol/L (ref 98–111)
Creatinine, Ser: 1.12 mg/dL (ref 0.61–1.24)
GFR calc Af Amer: >60 mL/min (ref >60)
GFR calc non Af Amer: >60 mL/min (ref >60)
Glucose, Bld: 87 mg/dL (ref 70–99)
Potassium: 3.9 mmol/L (ref 3.5–5.1)
Sodium: 140 mmol/L (ref 135–145)
Total Bilirubin: 1.5 mg/dL — ABNORMAL HIGH (ref 0.3–1.2)
Total Protein: 7.4 g/dL (ref 6.5–8.1)

## 2019-12-26 LAB — ETHANOL: Alcohol, Ethyl (B): 133 mg/dL — ABNORMAL HIGH (ref <10)

## 2019-12-26 MED ORDER — ONDANSETRON HCL 4 MG/2ML IJ SOLN
4.0000 mg | Freq: Once | INTRAMUSCULAR | Status: AC
  Administered 2019-12-26: 4 mg via INTRAVENOUS
  Filled 2019-12-26: qty 2

## 2019-12-26 MED ORDER — IOHEXOL 300 MG/ML  SOLN
100.0000 mL | Freq: Once | INTRAMUSCULAR | Status: AC | PRN
  Administered 2019-12-26: 100 mL via INTRAVENOUS

## 2019-12-26 MED ORDER — OXYCODONE-ACETAMINOPHEN 5-325 MG PO TABS
2.0000 | ORAL_TABLET | Freq: Once | ORAL | Status: AC
  Administered 2019-12-26: 04:00:00 2 via ORAL
  Filled 2019-12-26: qty 2

## 2019-12-26 MED ORDER — IBUPROFEN 800 MG PO TABS: 800.0000 mg | ORAL_TABLET | Freq: Three times a day (TID) | ORAL | 0 refills | Status: AC

## 2019-12-26 MED ORDER — OXYCODONE-ACETAMINOPHEN 5-325 MG PO TABS: 1.0000 | ORAL_TABLET | ORAL | 0 refills | Status: AC | PRN

## 2019-12-26 NOTE — ED Notes (Signed)
Discharge instructions reviewed with pt. Pt verbalized understanding.   
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
## 2019-12-26 NOTE — ED Notes (Signed)
Pt ambulated with no assist or complications. Pt c/o pain and soreness. Pt SPO2 100%.

## 2020-10-07 ENCOUNTER — Emergency Department (HOSPITAL_COMMUNITY): Payer: Self-pay

## 2020-10-07 ENCOUNTER — Other Ambulatory Visit: Payer: Self-pay

## 2020-10-07 ENCOUNTER — Encounter (HOSPITAL_COMMUNITY): Payer: Self-pay

## 2020-10-07 ENCOUNTER — Emergency Department (HOSPITAL_COMMUNITY)
Admission: EM | Admit: 2020-10-07 | Discharge: 2020-10-07 | Disposition: A | Discharge status: Home / Self Care | Payer: Self-pay | Attending: Emergency Medicine | Admitting: Emergency Medicine

## 2020-10-07 DIAGNOSIS — Y9367 Activity, basketball: Secondary | ICD-10-CM | POA: Insufficient documentation

## 2020-10-07 DIAGNOSIS — F17210 Nicotine dependence, cigarettes, uncomplicated: Secondary | ICD-10-CM | POA: Insufficient documentation

## 2020-10-07 DIAGNOSIS — X500XXA Overexertion from strenuous movement or load, initial encounter: Secondary | ICD-10-CM | POA: Insufficient documentation

## 2020-10-07 DIAGNOSIS — R52 Pain, unspecified: Secondary | ICD-10-CM

## 2020-10-07 DIAGNOSIS — S99911A Unspecified injury of right ankle, initial encounter: Secondary | ICD-10-CM | POA: Insufficient documentation

## 2020-10-07 DIAGNOSIS — M79671 Pain in right foot: Secondary | ICD-10-CM | POA: Insufficient documentation

## 2020-10-07 DIAGNOSIS — M25561 Pain in right knee: Secondary | ICD-10-CM | POA: Insufficient documentation

## 2020-10-07 MED ORDER — NAPROXEN 250 MG PO TABS
500.0000 mg | ORAL_TABLET | Freq: Once | ORAL | Status: AC
  Administered 2020-10-07: 500 mg via ORAL
  Filled 2020-10-07: qty 2

## 2020-10-07 MED ORDER — NAPROXEN 500 MG PO TABS: 500.0000 mg | ORAL_TABLET | Freq: Two times a day (BID) | ORAL | 0 refills | Status: AC | PRN

## 2020-10-07 NOTE — ED Triage Notes (Signed)
Patient complains of right knee and ankle pain after injuring while playing basketball. Pain with ambulation and any ROM.

## 2020-10-07 NOTE — ED Provider Notes (Signed)
[facility] EMERGENCY DEPARTMENT Provider Note   CSN: [id number] Arrival date & time: 10/07/20  1133     History Chief Complaint  Patient presents with  . Ankle Pain    Blake Williams is a 29 y.o. male without significant past medical hx who presents to the ED with complaints of R ankle pain S/p injury last night. Patient was playing basketball when he landed on the R foot and inverted the angle. Denies head injury or LOC associated with injury. Having pain primarily to the R ankle, also having some pain to the R knee & foot. Worse with movement. No alleviating factors. Denies numbness, tingling, weakness, open wounds, or other areas of injury.  HPI     History reviewed. No pertinent past medical history.  There are no problems to display for this patient.   History reviewed. No pertinent surgical history.     No family history on file.  Social History   Tobacco Use  . Smoking status: Current Some Day Smoker    Types: Cigarettes  . Smokeless tobacco: Never Used  Vaping Use  . Vaping Use: Never used  Substance Use Topics  . Alcohol use: Yes    Alcohol/week: 4.0 standard drinks    Types: 4 Standard drinks or equivalent per week    Comment: Socially   . Drug use: Yes    Frequency: 1.0 times per week    Types: Marijuana    Home Medications Prior to Admission medications   Medication Sig Start Date End Date Taking? Authorizing Provider  diphenhydrAMINE (BENADRYL) 25 MG tablet Take 1 tablet (25 mg total) by mouth every 6 (six) hours as needed. 05/15/19   Sidonie Dexheimer R, PA-C  EPINEPHrine 0.3 mg/0.3 mL IJ SOAJ injection Inject 0.3 mLs (0.3 mg total) into the muscle as needed for anaphylaxis. 05/15/19   Kmari Brian R, PA-C  famotidine (PEPCID) 20 MG tablet Take 1 tablet (20 mg total) by mouth 2 (two) times daily. 05/15/19   Jamol Ginyard R, PA-C  ibuprofen (ADVIL) 800 MG tablet Take 1 tablet (800 mg total) by mouth 3 (three) times  daily. 12/26/19   Garlon Hatchet, PA-C  oxyCODONE-acetaminophen (PERCOCET) 5-325 MG tablet Take 1 tablet by mouth every 4 (four) hours as needed. 12/26/19   Garlon Hatchet, PA-C  predniSONE (DELTASONE) 50 MG tablet Take 1 tablet (50 mg total) by mouth daily with breakfast. 05/15/19   Sian Rockers, Pleas Koch, PA-C    Allergies    Patient has no known allergies.  Review of Systems   Review of Systems  Constitutional: Negative for chills and fever.  Respiratory: Negative for shortness of breath.   Cardiovascular: Negative for chest pain.  Gastrointestinal: Negative for abdominal pain and vomiting.  Musculoskeletal: Positive for arthralgias. Negative for back pain and neck pain.  Skin: Negative for wound.  Neurological: Negative for weakness and numbness.    Physical Exam Updated Vital Signs BP 131/82 (BP Location: Left Arm)   Pulse 80   Temp 98.3 F (36.8 C) (Oral)   Resp 18   Ht 5\' 7"  (1.702 m)   Wt 59 kg   SpO2 100%   BMI 20.36 kg/m   Physical Exam Vitals and nursing note reviewed.  Constitutional:      General: He is not in acute distress.    Appearance: He is not ill-appearing or toxic-appearing.  HENT:     Head: Normocephalic and atraumatic.  Cardiovascular:     Pulses:  Dorsalis pedis pulses are 2+ on the right side and 2+ on the left side.       Posterior tibial pulses are 2+ on the right side and 2+ on the left side.  Pulmonary:     Effort: Pulmonary effort is normal.  Chest:     Chest wall: No tenderness.  Abdominal:     Tenderness: There is no abdominal tenderness.  Musculoskeletal:     Cervical back: Normal range of motion and neck supple. No tenderness.     Comments: Lower extremities: No obvious deformity, ecchymosis, warmth, or open wounds. Patient has intact AROM to bilateral hips, knees, ankles, and all digits. RLE: Mild swelling to lateral malleolus. Tender to palpation to posterior knee & mild to medial tibial condyle, lateral malleolus, lateral  ankle ligaments, and @ the base of the 5th metatarsal. Otherwise nontender.  Back: No midline tenderness.   Skin:    General: Skin is warm and dry.     Capillary Refill: Capillary refill takes less than 2 seconds.  Neurological:     Mental Status: He is alert.     Comments: Alert. Clear speech. Sensation grossly intact to bilateral lower extremities. 5/5 strength with plantar/dorsiflexion bilaterally. Patient ambulatory w/ antalgic gait.   Psychiatric:        Mood and Affect: Mood normal.        Behavior: Behavior normal.     ED Results / Procedures / Treatments   Labs (all labs ordered are listed, but only abnormal results are displayed) Labs Reviewed - No data to display  EKG None  Radiology DG Knee Complete 4 Views Right  Result Date: 10/07/2020 CLINICAL DATA:  Injury while playing basketball EXAM: RIGHT KNEE - COMPLETE 4+ VIEW COMPARISON:  None. FINDINGS: Frontal, lateral, and bilateral oblique views were obtained. No fracture or dislocation. No joint effusion. Joint spaces appear normal. No erosive change. IMPRESSION: No fracture, dislocation, or joint effusion. No evident arthropathy. Electronically Signed   By: Bretta Bang III M.D.   On: 10/07/2020 12:59   DG Foot Complete Right  Result Date: 10/07/2020 CLINICAL DATA:  Injury while playing basketball EXAM: RIGHT FOOT COMPLETE - 3+ VIEW COMPARISON:  None. FINDINGS: Frontal, oblique, and lateral views were obtained. There is no fracture or dislocation. Joint spaces appear normal. No erosive change. IMPRESSION: No fracture or dislocation.  No evident arthropathy. Electronically Signed   By: Bretta Bang III M.D.   On: 10/07/2020 13:00    Procedures Procedures (including critical care time)  Medications Ordered in ED Medications  naproxen (NAPROSYN) tablet 500 mg (has no administration in time range)    ED Course  I have reviewed the triage vital signs and the nursing notes.  Pertinent labs & imaging results  that were available during my care of the patient were reviewed by me and considered in my medical decision making (see chart for details).    MDM Rules/Calculators/A&P                          Patient presents to the ED S/p RLE injury last night. Seems to be isolated injury.  Nontoxic, vitals WNL.  Exam w/o open wounds or signs of infection.  No edema/calf tenderness- doubt DVT.  Well perfused, symmetric pulses- doubt arterial thrombosis.  I ordered an ankle x-ray, foot & knee x-rays ordered by triage, I have personally reviewed & interpreted imaging- no fx/dislocation. Swelling & small effusion to ankle as above. NVI distally.  ASO ordered, offered crutches- patient declined, PRICE, naproxen prescription (last creatinine WNL), and orthopedics follow up. I discussed results, treatment plan, need for follow-up, and return precautions with the patient. Provided opportunity for questions, patient confirmed understanding and is in agreement with plan.   Final Clinical Impression(s) / ED Diagnoses Final diagnoses:  Pain  Injury of right ankle, initial encounter    Rx / DC Orders ED Discharge Orders         Ordered    naproxen (NAPROSYN) 500 MG tablet  2 times daily PRN        10/07/20 [address], [place], PA-C 10/07/20 1402    Linwood Dibbles, MD 10/08/20 [phone]

## 2020-10-07 NOTE — Discharge Instructions (Addendum)
Please read and follow all provided instructions.  You have been seen today for an injury to your right ankle/knee/foot.   Tests performed today include: Xrays of the affected areas - does NOT show any broken bones or dislocations.  Vital signs. See below for your results today.   Home care instructions: -- *PRICE in the first 24-48 hours after injury: Protect (with brace, splint, sling), if given by your provider Rest Ice- Do not apply ice pack directly to your skin, place towel or similar between your skin and ice/ice pack. Apply ice for 20 min, then remove for 40 min while awake Compression- Wear brace, elastic bandage, splint as directed by your provider Elevate affected extremity above the level of your heart when not walking around for the first 24-48 hours   Medications:  - Naproxen is a nonsteroidal anti-inflammatory medication that will help with pain and swelling. Be sure to take this medication as prescribed with food, 1 pill every 12 hours,  It should be taken with food, as it can cause stomach upset, and more seriously, stomach bleeding. Do not take other nonsteroidal anti-inflammatory medications with this such as Advil, Motrin, Aleve, Mobic, Goodie Powder, or Motrin.    You make take Tylenol per over the counter dosing with these medications.   We have prescribed you new medication(s) today. Discuss the medications prescribed today with your pharmacist as they can have adverse effects and interactions with your other medicines including over the counter and prescribed medications. Seek medical evaluation if you start to experience new or abnormal symptoms after taking one of these medicines, seek care immediately if you start to experience difficulty breathing, feeling of your throat closing, facial swelling, or rash as these could be indications of a more serious allergic reaction   Follow-up instructions: Please follow-up with your primary care provider or the provided  orthopedic physician (bone specialist) if you continue to have significant pain in 1 week. In this case you may have a more severe injury that requires further care.   Return instructions:  Please return if your digits or extremity are numb or tingling, appear gray or blue, or you have severe pain (also elevate the extremity and loosen splint or wrap if you were given one) Please return if you have redness or fevers.  Please return to the Emergency Department if you experience worsening symptoms.  Please return if you have any other emergent concerns. Additional Information:  Your vital signs today were: BP 131/82 (BP Location: Left Arm)   Pulse 80   Temp 98.3 F (36.8 C) (Oral)   Resp 18   Ht 5\' 7"  (1.702 m)   Wt 59 kg   SpO2 100%   BMI 20.36 kg/m  If your blood pressure (BP) was elevated above 135/85 this visit, please have this repeated by your doctor within one month. ---------------

## 2021-06-28 IMAGING — DX DG KNEE COMPLETE 4+V*R*
4 series · 4 of 4 positions shown · non-contrast
Comparison: None.

CLINICAL DATA: Injury while playing basketball

EXAM:
RIGHT KNEE - COMPLETE 4+ VIEW

[knee ap]
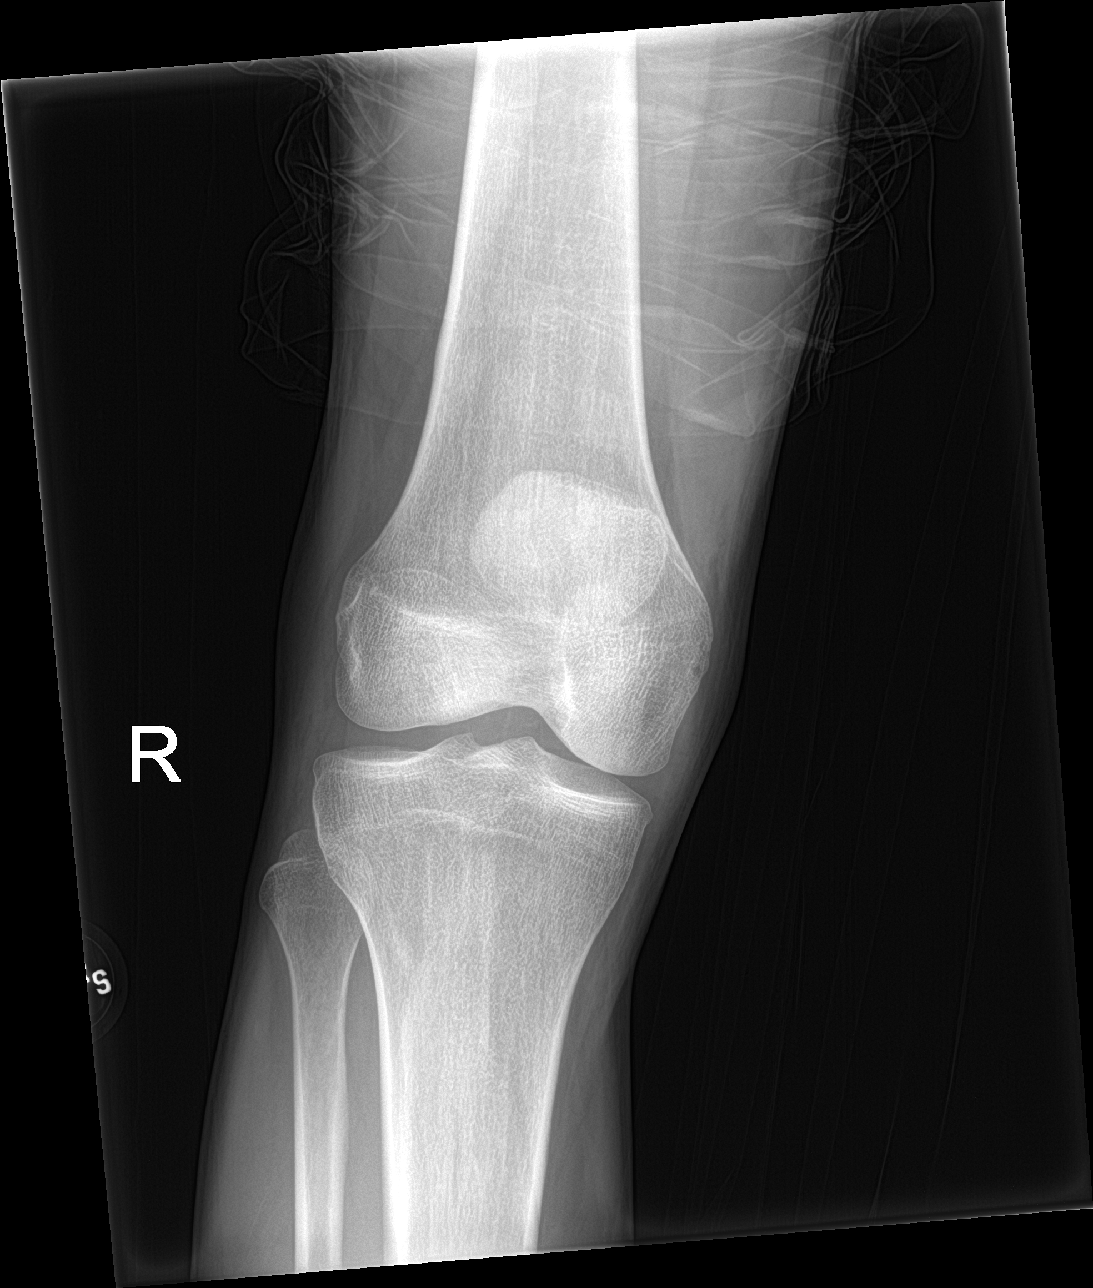

[knee lat]
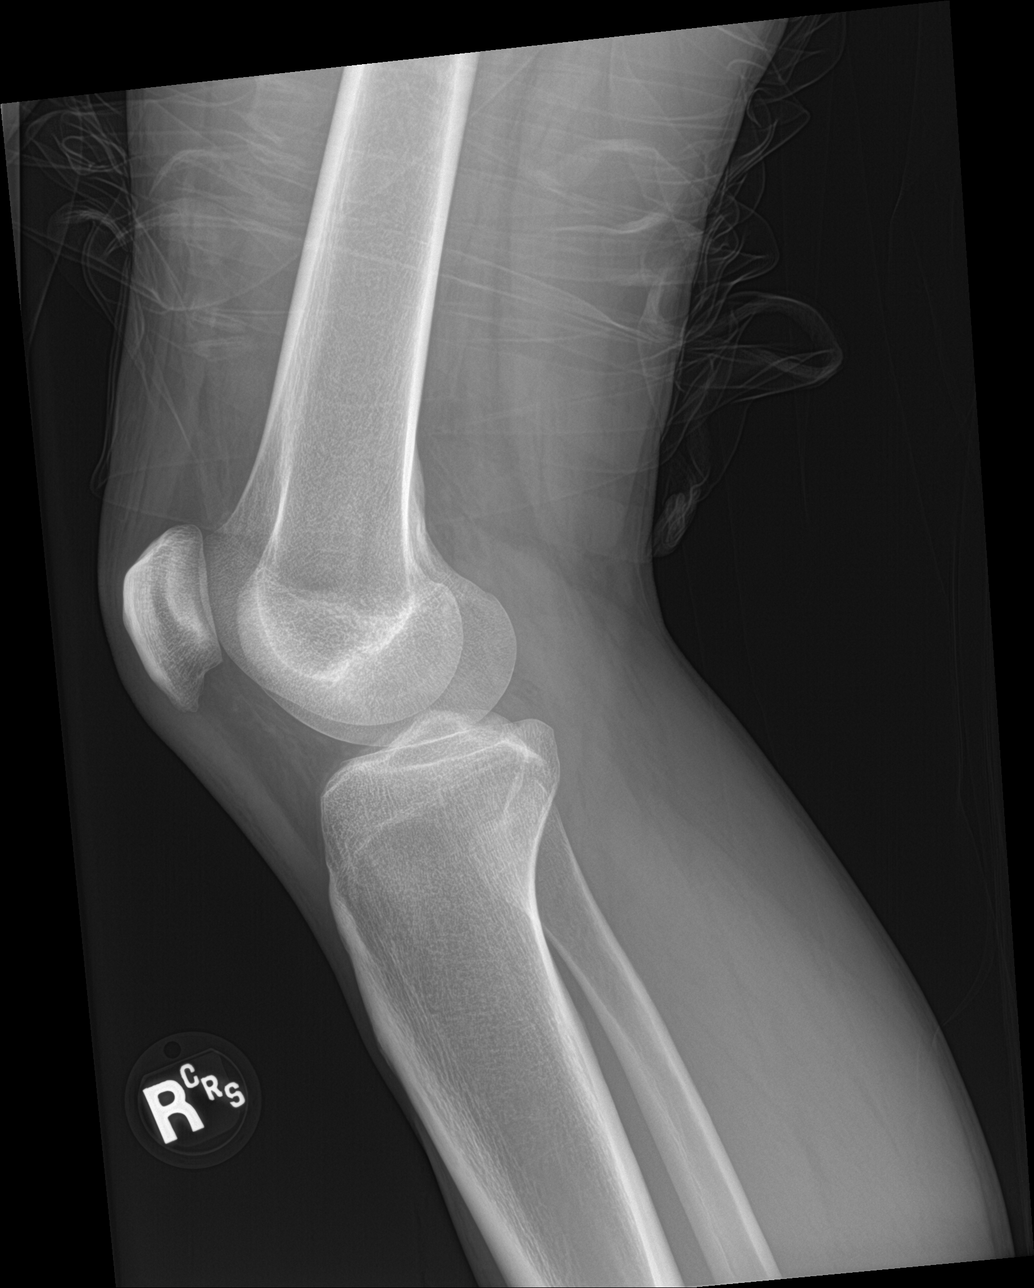

[knee obl (1 of 2)]
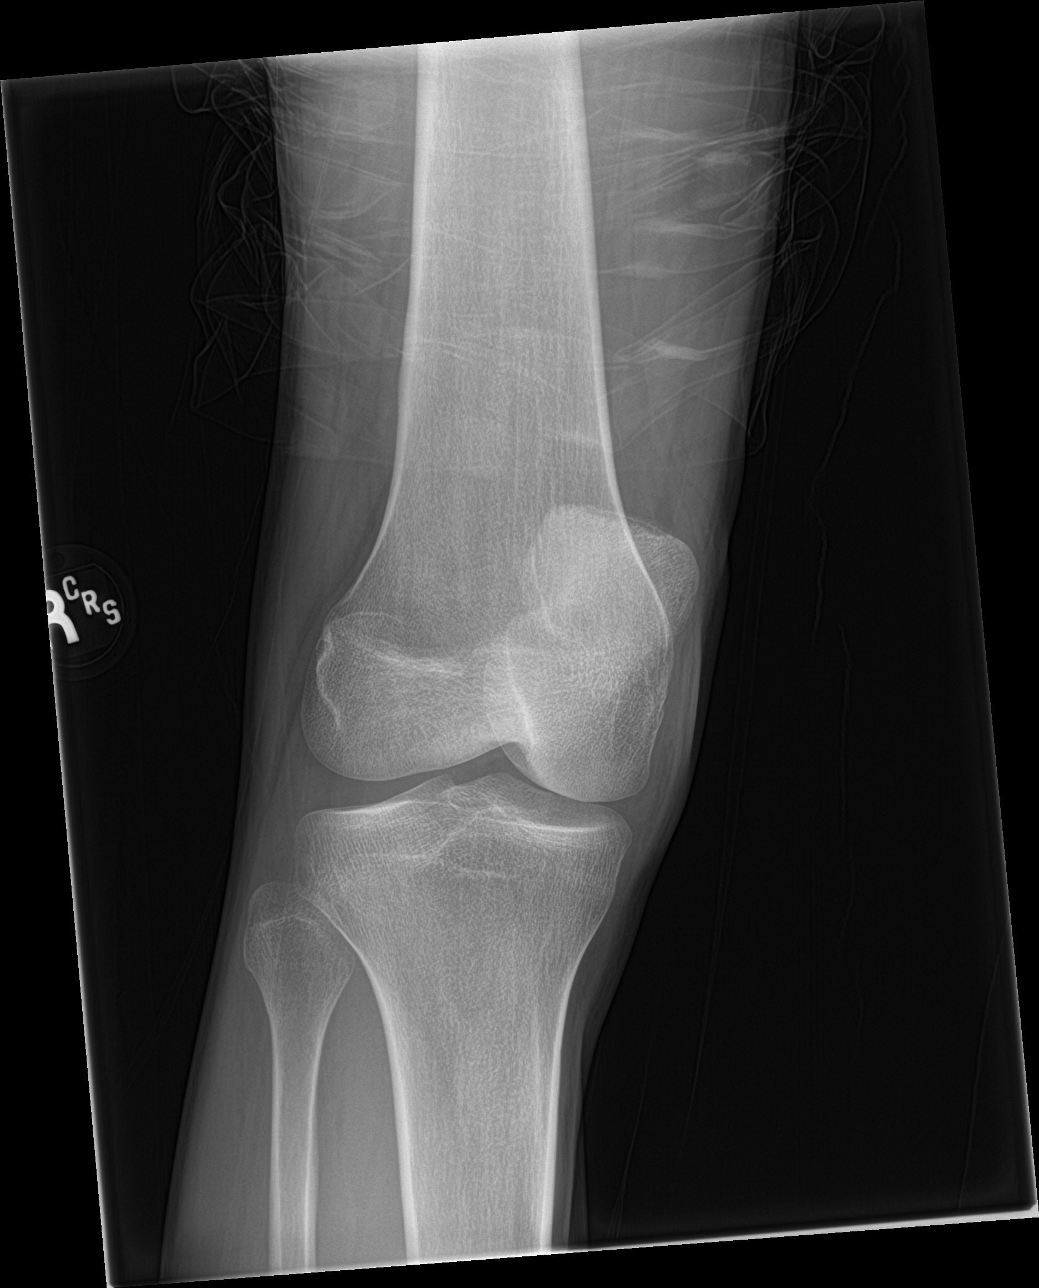

[knee obl (2 of 2)]
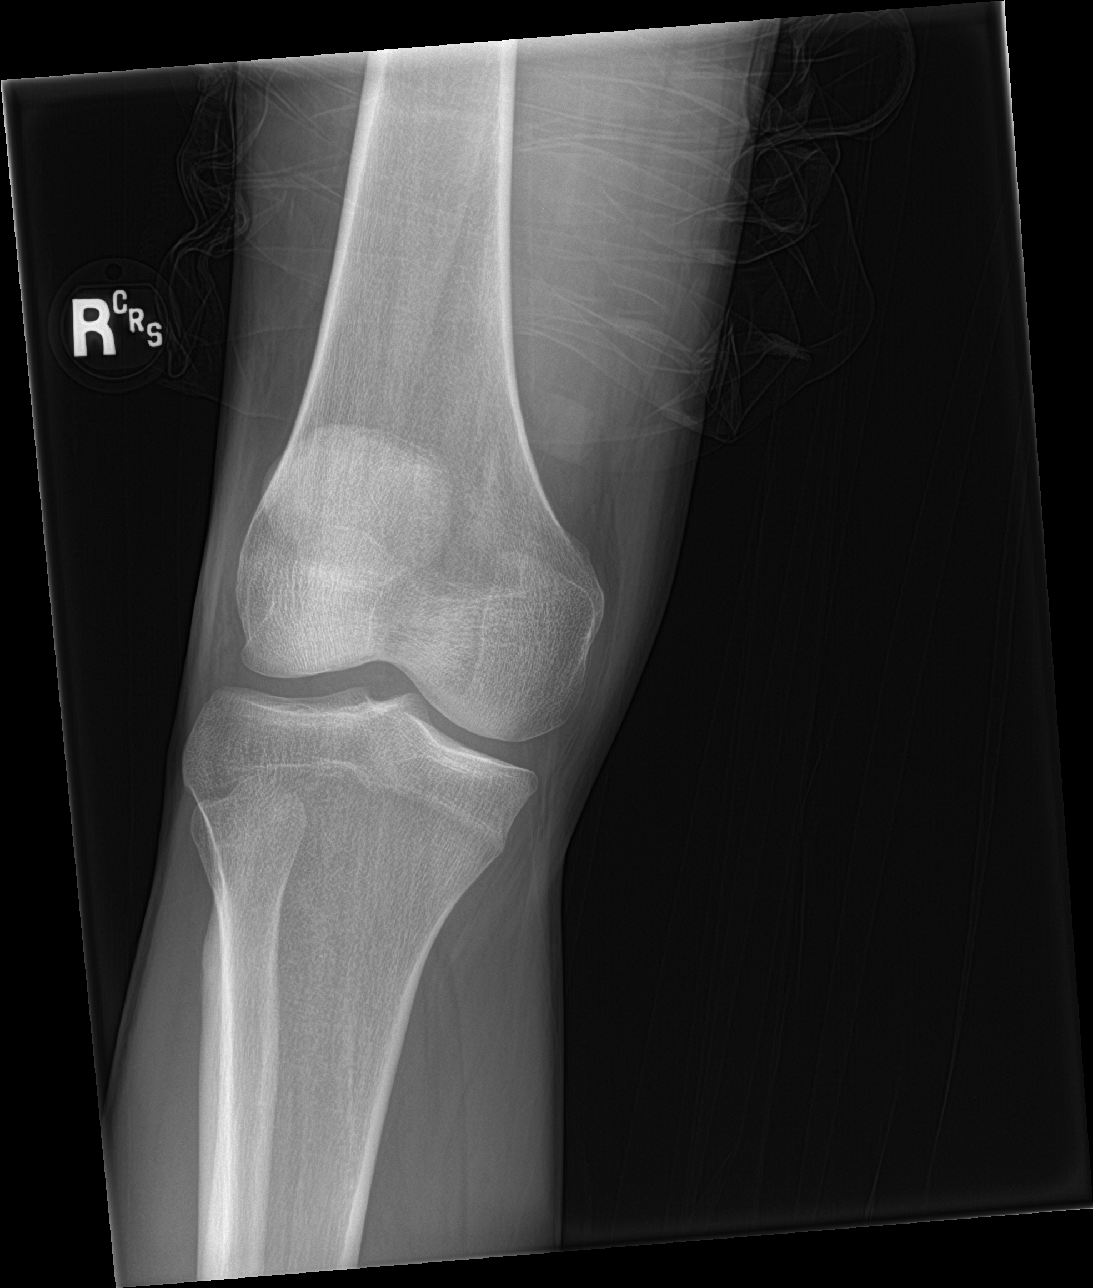

[4 of 4 positions shown; findings below may reference images not displayed]

FINDINGS: Frontal, lateral, and bilateral oblique views were obtained. No
fracture or dislocation. No joint effusion. Joint spaces appear
normal. No erosive change.
IMPRESSION: No fracture, dislocation, or joint effusion. No evident arthropathy.
